# Patient Record
Sex: Male | Born: 1960 | Hispanic: Yes | Marital: Single | Smoking: Never smoker
Health system: Southern US, Community
[De-identification: ages and names within clinical notes are randomized; demographics above are authoritative.]

## PROBLEM LIST (undated history)

## (undated) HISTORY — PX: CHOLECYSTECTOMY: SHX55

## (undated) HISTORY — PX: BACK SURGERY: SHX140

---

## 2010-01-05 ENCOUNTER — Emergency Department: Payer: Self-pay | Admitting: Emergency Medicine

## 2010-01-27 ENCOUNTER — Ambulatory Visit: Payer: Self-pay | Admitting: Surgery

## 2010-02-21 ENCOUNTER — Inpatient Hospital Stay: Payer: Self-pay | Admitting: Surgery

## 2010-03-02 LAB — PATHOLOGY REPORT

## 2010-03-20 ENCOUNTER — Other Ambulatory Visit: Payer: Self-pay | Admitting: Surgery

## 2010-03-22 ENCOUNTER — Ambulatory Visit: Payer: Self-pay | Admitting: Surgery

## 2010-03-24 LAB — PATHOLOGY REPORT

## 2011-06-14 IMAGING — CT CT CHEST-ABD-PELV W/ CM
1 of 2 series · 13 of 32 positions shown, 18 images · IV contrast (APPLIED)
Comparison: none

REASON FOR EXAM: (1) hypoxia; (2) abd pain
COMMENTS:

PROCEDURE:     CT  - CT CHEST ABDOMEN AND PELVIS W  - February 26, 2010 [DATE]
RESULT:     Comparison: MR cholangiogram 02/23/2010
TECHNIQUE: Multiple axial images obtained of the chest, abdomen, and pelvis,
after administration of 100 mL Isovue 370 and oral contrast. The chest
images were obtained according to the PE protocol. These images were
reviewed on a [employer] multiplanar work station.

[Series 6: abdomen · axial · 0.80mm/px · z∈[-486,-42]mm · 13 of 166 slices shown, 18 images]
[im 9/166  soft-tissue]
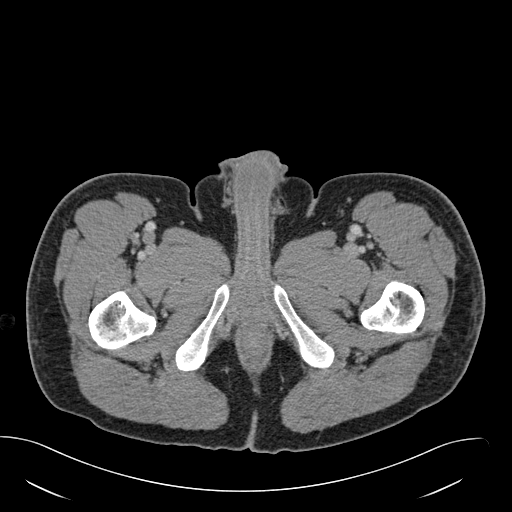
[im 9/166  bone]
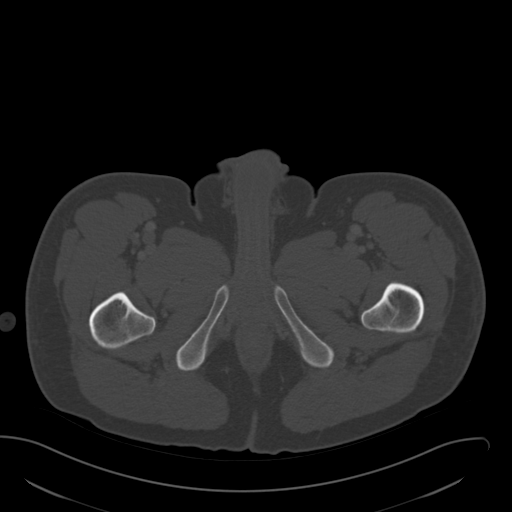
[im 25/166  soft-tissue]
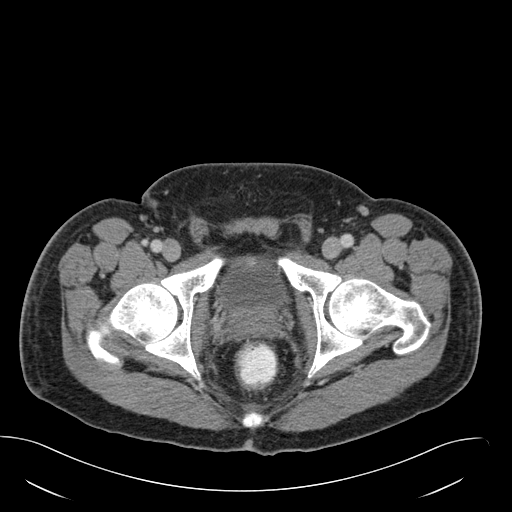
[im 34/166  soft-tissue]
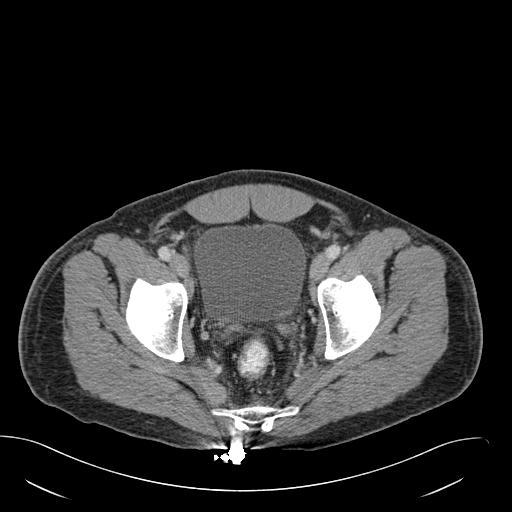
[im 50/166  soft-tissue]
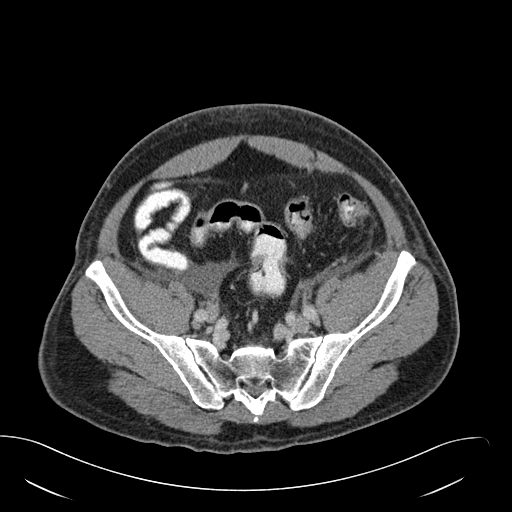
[im 67/166  soft-tissue]
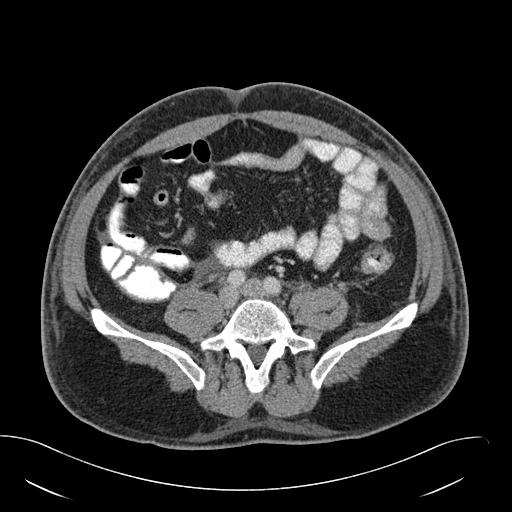
[im 75/166  soft-tissue]
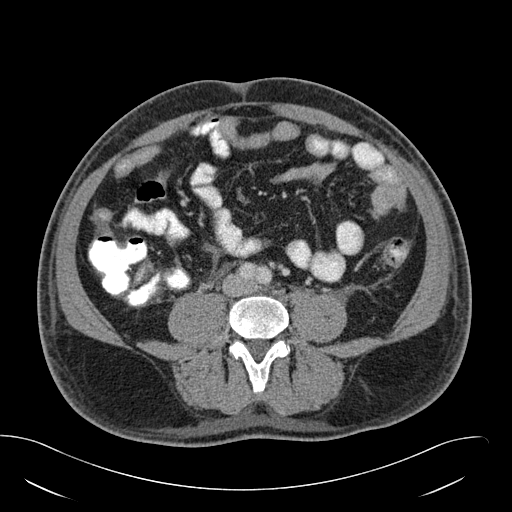
[im 91/166  soft-tissue]
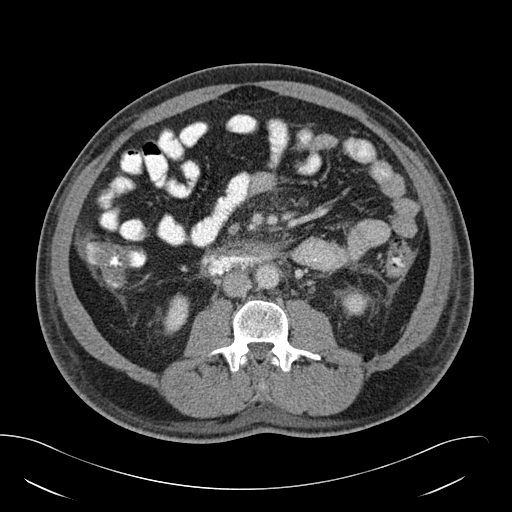
[im 100/166  soft-tissue]
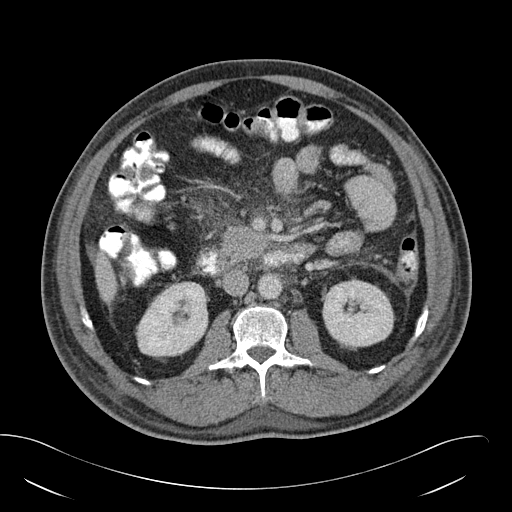
[im 116/166  soft-tissue]
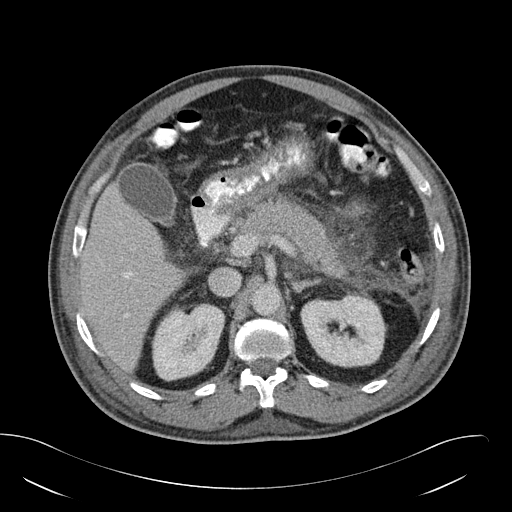
[im 116/166  bone]
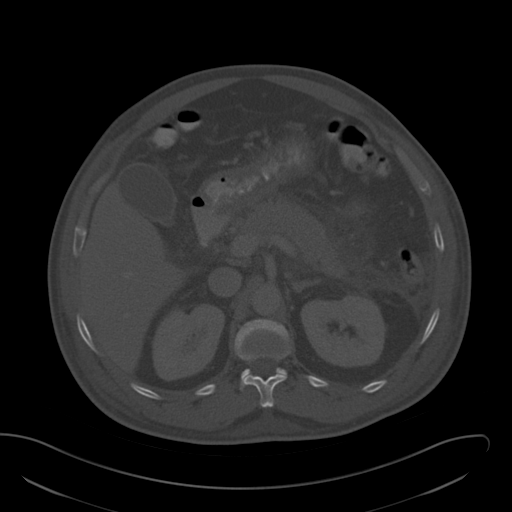
[im 133/166  soft-tissue]
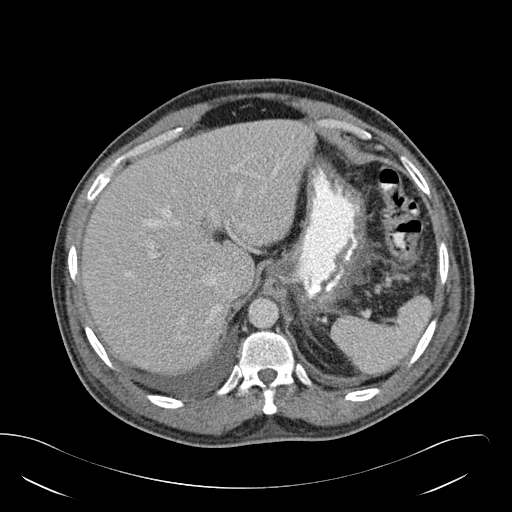
[im 133/166  lung]
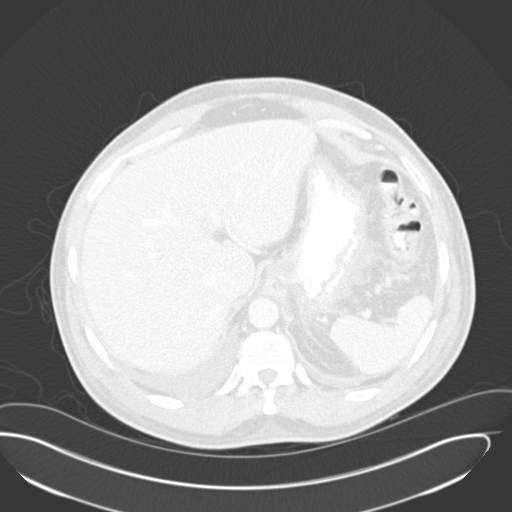
[im 141/166  soft-tissue]
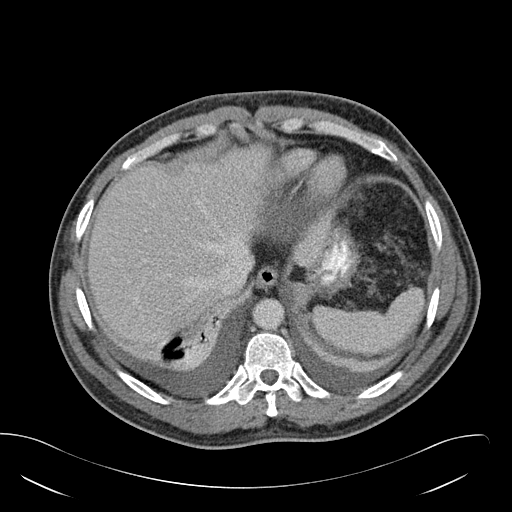
[im 141/166  lung]
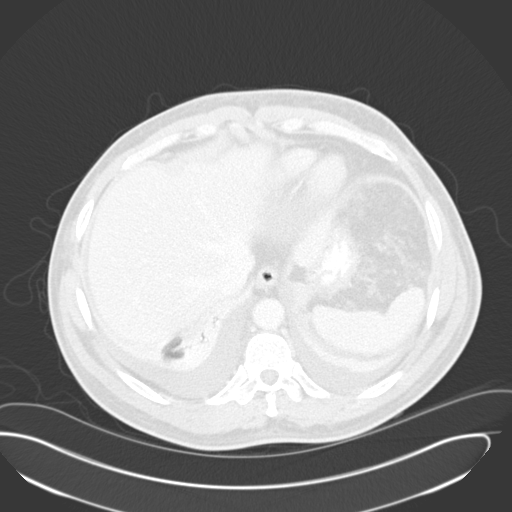
[im 149/166  lung]
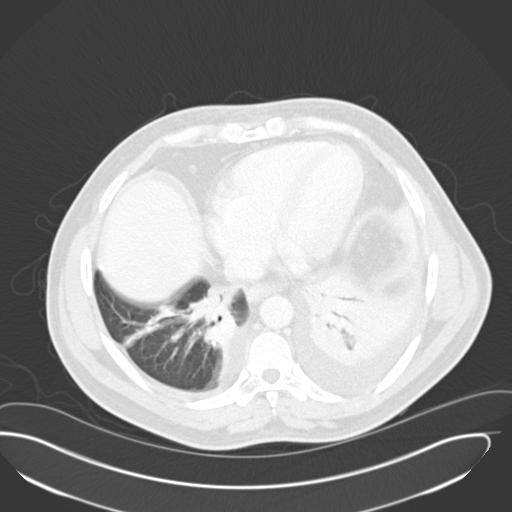
[im 157/166  soft-tissue]
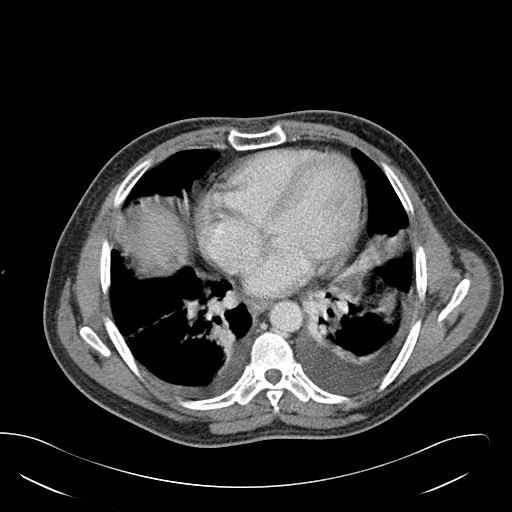
[im 157/166  lung]
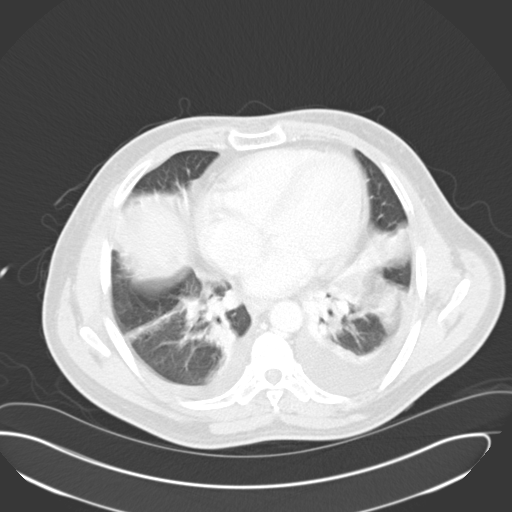

[13 of 32 positions shown; findings below may reference images not displayed]

FINDINGS: No mediastinal, hilar, or axillary lymphadenopathy. There are small
bilateral pleural effusions. The thoracic aorta is normal in caliber.
Evaluation of the segmental arteries in the lower lobes is limited by the
atelectasis. Otherwise, no pulmonary embolus identified. Homogeneous opacity
and consolidation with volume loss in the lower lobes likely related to
atelectasis. Mild linear opacities in the left upper lobe likely represent
atelectasis.

The liver, gallbladder, spleen, and adrenals are unremarkable. There is a
moderate degree of inflammatory stranding surrounding the pancreas. There
are no focal areas of hypoattenuation within the pancreas. The stranding
extends inferiorly along the lateral conal fracture. No defined fluid
collections. The splenic vein is patent. Comparison with the prior MRI is
difficult secondary to differences in technique, but the degree of stranding
appears increased.  There is a small, 1.0 cm soft tissue filling defect in
in the second portion of the duodenum in the region of the ampulla of Vater.
This may represent an edematous ampulla, however an mass cannot excluded.
This is seen on image 70 of the abdominal CT images. The pancreatic duct is
not dilated.

The small and large bowel are normal in caliber. The appendix is not
definitely identified. However, there are no discrete inflammatory changes
about the cecum.

No aggressive lytic or sclerotic osseous lesions identified.
IMPRESSION: 1. No pulmonary embolus identified. Evaluation of the lower lobe segmental
arteries is limited by atelectasis in the lower lobes.
2. Small bilateral pleural effusions. Bibasilar consolidation and volume
loss likely related to atelectasis. Infection would be difficult to exclude.
3. Findings of acute pancreatitis, which appear slightly increased from
prior MRI. There are no defined fluid collection at this time.
4. Small soft tissue filling defect in the region of the ampulla of Vater
may represent an edematous ampulla. However, a small mass cannot be
excluded. Further evaluation with ERCP when clinically able is suggested.

## 2011-07-18 ENCOUNTER — Ambulatory Visit: Payer: Self-pay | Admitting: Surgery

## 2011-07-18 LAB — CBC
HCT: 41.6 % (ref 40.0–52.0)
HGB: 13.8 g/dL (ref 13.0–18.0)
MCHC: 33.2 g/dL (ref 32.0–36.0)
MCV: 90 fL (ref 80–100)
Platelet: 254 10*3/uL (ref 150–440)
RBC: 4.61 10*6/uL (ref 4.40–5.90)
WBC: 9.7 10*3/uL (ref 3.8–10.6)

## 2011-07-18 LAB — BASIC METABOLIC PANEL
Anion Gap: 6 — ABNORMAL LOW (ref 7–16)
BUN: 16 mg/dL (ref 7–18)
Co2: 28 mmol/L (ref 21–32)
EGFR (African American): >60
EGFR (Non-African Amer.): >60
Potassium: 3.5 mmol/L (ref 3.5–5.1)

## 2011-07-25 ENCOUNTER — Ambulatory Visit: Payer: Self-pay | Admitting: Surgery

## 2011-07-26 LAB — PATHOLOGY REPORT

## 2012-09-18 ENCOUNTER — Inpatient Hospital Stay: Payer: Self-pay | Admitting: Internal Medicine

## 2012-09-18 LAB — COMPREHENSIVE METABOLIC PANEL
Albumin: 2.9 g/dL — ABNORMAL LOW (ref 3.4–5.0)
Anion Gap: 7 (ref 7–16)
BUN: 15 mg/dL (ref 7–18)
Calcium, Total: 9.8 mg/dL (ref 8.5–10.1)
Chloride: 104 mmol/L (ref 98–107)
Co2: 27 mmol/L (ref 21–32)
Creatinine: 0.92 mg/dL (ref 0.60–1.30)
EGFR (African American): >60
EGFR (Non-African Amer.): >60
Glucose: 113 mg/dL — ABNORMAL HIGH (ref 65–99)
Osmolality: 277 (ref 275–301)
Potassium: 3.5 mmol/L (ref 3.5–5.1)
SGOT(AST): 52 U/L — ABNORMAL HIGH (ref 15–37)
SGPT (ALT): 67 U/L (ref 12–78)
Sodium: 138 mmol/L (ref 136–145)
Total Protein: 8 g/dL (ref 6.4–8.2)

## 2012-09-18 LAB — CBC
HCT: 38.9 % — ABNORMAL LOW (ref 40.0–52.0)
HGB: 13.2 g/dL (ref 13.0–18.0)
MCHC: 34.1 g/dL (ref 32.0–36.0)
WBC: 16.7 10*3/uL — ABNORMAL HIGH (ref 3.8–10.6)

## 2012-09-19 LAB — BASIC METABOLIC PANEL
Anion Gap: 6 — ABNORMAL LOW (ref 7–16)
Calcium, Total: 9.2 mg/dL (ref 8.5–10.1)
Chloride: 105 mmol/L (ref 98–107)
Co2: 27 mmol/L (ref 21–32)
Creatinine: 1 mg/dL (ref 0.60–1.30)
EGFR (Non-African Amer.): >60
Glucose: 125 mg/dL — ABNORMAL HIGH (ref 65–99)
Osmolality: 277 (ref 275–301)

## 2012-09-19 LAB — CBC WITH DIFFERENTIAL/PLATELET
Eosinophil #: 0.3 10*3/uL (ref 0.0–0.7)
Eosinophil %: 2.4 %
HGB: 12 g/dL — ABNORMAL LOW (ref 13.0–18.0)
Lymphocyte %: 15 %
MCH: 29.4 pg (ref 26.0–34.0)
MCHC: 33.7 g/dL (ref 32.0–36.0)
MCV: 87 fL (ref 80–100)
Monocyte #: 1.2 x10 3/mm — ABNORMAL HIGH (ref 0.2–1.0)
Monocyte %: 9.4 %
RBC: 4.08 10*6/uL — ABNORMAL LOW (ref 4.40–5.90)

## 2012-09-19 LAB — HEPATIC FUNCTION PANEL A (ARMC)
Albumin: 2.5 g/dL — ABNORMAL LOW (ref 3.4–5.0)
Alkaline Phosphatase: 255 U/L — ABNORMAL HIGH (ref 50–136)
Bilirubin, Direct: 0.2 mg/dL (ref 0.00–0.20)
Bilirubin,Total: 0.6 mg/dL (ref 0.2–1.0)
Total Protein: 7 g/dL (ref 6.4–8.2)

## 2012-09-20 LAB — CBC WITH DIFFERENTIAL/PLATELET
Basophil %: 0.7 %
Eosinophil #: 0.5 10*3/uL (ref 0.0–0.7)
HCT: 35.6 % — ABNORMAL LOW (ref 40.0–52.0)
HGB: 11.8 g/dL — ABNORMAL LOW (ref 13.0–18.0)
Lymphocyte %: 33.8 %
MCV: 88 fL (ref 80–100)
Platelet: 332 10*3/uL (ref 150–440)
RBC: 4.05 10*6/uL — ABNORMAL LOW (ref 4.40–5.90)
RDW: 15.1 % — ABNORMAL HIGH (ref 11.5–14.5)
WBC: 12 10*3/uL — ABNORMAL HIGH (ref 3.8–10.6)

## 2012-09-20 LAB — VANCOMYCIN, TROUGH: Vancomycin, Trough: 8 ug/mL — ABNORMAL LOW (ref 10–20)

## 2012-09-21 LAB — CBC WITH DIFFERENTIAL/PLATELET
Basophil %: 0.7 %
Eosinophil #: 0.5 10*3/uL (ref 0.0–0.7)
Lymphocyte #: 4.2 10*3/uL — ABNORMAL HIGH (ref 1.0–3.6)
MCH: 29.7 pg (ref 26.0–34.0)
MCHC: 33.8 g/dL (ref 32.0–36.0)
MCV: 88 fL (ref 80–100)
Monocyte #: 1.2 x10 3/mm — ABNORMAL HIGH (ref 0.2–1.0)
RDW: 15.1 % — ABNORMAL HIGH (ref 11.5–14.5)
WBC: 10.7 10*3/uL — ABNORMAL HIGH (ref 3.8–10.6)

## 2012-09-22 LAB — CBC WITH DIFFERENTIAL/PLATELET
Basophil %: 0.7 %
Eosinophil #: 0.6 10*3/uL (ref 0.0–0.7)
HCT: 33.9 % — ABNORMAL LOW (ref 40.0–52.0)
HGB: 11.5 g/dL — ABNORMAL LOW (ref 13.0–18.0)
Lymphocyte %: 25.8 %
MCHC: 34 g/dL (ref 32.0–36.0)
MCV: 88 fL (ref 80–100)
Neutrophil #: 8.7 10*3/uL — ABNORMAL HIGH (ref 1.4–6.5)
Platelet: 383 10*3/uL (ref 150–440)
RDW: 14.9 % — ABNORMAL HIGH (ref 11.5–14.5)
WBC: 14.2 10*3/uL — ABNORMAL HIGH (ref 3.8–10.6)

## 2012-09-22 LAB — VANCOMYCIN, TROUGH: Vancomycin, Trough: 7 ug/mL — ABNORMAL LOW (ref 10–20)

## 2012-09-23 LAB — CBC WITH DIFFERENTIAL/PLATELET
Basophil #: 0.1 10*3/uL (ref 0.0–0.1)
Eosinophil #: 0.5 10*3/uL (ref 0.0–0.7)
Eosinophil %: 3.3 %
HCT: 37 % — ABNORMAL LOW (ref 40.0–52.0)
HGB: 12.6 g/dL — ABNORMAL LOW (ref 13.0–18.0)
Lymphocyte %: 17.7 %
MCH: 29.9 pg (ref 26.0–34.0)
Monocyte #: 1 x10 3/mm (ref 0.2–1.0)
Monocyte %: 6.5 %
Neutrophil #: 10.9 10*3/uL — ABNORMAL HIGH (ref 1.4–6.5)
Neutrophil %: 72 %
Platelet: 416 10*3/uL (ref 150–440)
RBC: 4.21 10*6/uL — ABNORMAL LOW (ref 4.40–5.90)
WBC: 15.2 10*3/uL — ABNORMAL HIGH (ref 3.8–10.6)

## 2012-09-23 LAB — PATHOLOGY REPORT

## 2012-09-23 LAB — WOUND CULTURE

## 2012-09-24 LAB — WBC: WBC: 12.3 10*3/uL — ABNORMAL HIGH (ref 3.8–10.6)

## 2014-06-18 NOTE — Discharge Summary (Signed)
PATIENT NAME:  Miguel [name], Miguel [name] MR#:  [id number] DATE OF BIRTH:  07/29/1960  DATE OF ADMISSION:  09/18/2012 DATE OF DISCHARGE:  09/24/2012   ADMITTING PHYSICIAN: Miguel Furnaceoberto Sanchez Gutierrez, MD  DISCHARGING PHYSICIAN: Miguel Baasadhika Madgeline Rayo, MD  PRIMARY CARE PHYSICIAN: None.   CONSULTATIONS IN THE HOSPITAL: Podiatry consultation by Dr. Linus Galasodd [name].   FINAL DIAGNOSES: 1.  Sepsis.  2.  Right foot dorsum of the foot abscess/cellulitis, status post incision and drainage.  3.  Right groin pain secondary to referral pain from his right foot movement.   DISCHARGE HOME MEDICATIONS: 1.  Ciprofloxacin 500 mg [name].o. b.i.d. for 7 days.  2.  Augmentin 875 mg [name].o. b.i.d. for 7 days.  3.  Flexeril 5 mg tablet 1 to 2 tablets q.8 hours [name].r.n.  4.  Percocet 5/325 mg 2 tablets q.8 hours [name].r.n. for pain.   DISCHARGE DIET: Regular diet.   DISCHARGE ACTIVITY: As tolerated.   FOLLOWUP INSTRUCTIONS:  1.  Follow up with Dr. Linus Galasodd [name], podiatry, in 1 week.  2.  Home health PT and nursing.  3.  Dressing changes on the right foot dorsum, needs packing every day and pressure bandage on it.  LABORATORY AND IMAGING STUDIES PRIOR TO DISCHARGE: WBC was improved down to 12.3, hemoglobin 12.6, hematocrit 37.0, platelet count 416.   Ultrasound Doppler lower extremity on the right side showing no evidence of any DVT or fluid collection noted.   Cellulitis and abscess negative for malignancy from the infected tissue on the right foot biopsy from the  I and D.   Wound cultures growing light growth of Streptococcus pyogenes.  Abdominal ultrasound showing status post cholecystectomy, otherwise normal.  CT of the right foot showing diffuse soft tissue swelling, otherwise unremarkable, no evidence of foreign body or abscess.   Sodium 138, potassium 3.4, chloride 105, bicarbonate 27, BUN 12, creatinine 1, glucose 125 and calcium of 9.2.   ALT 106, AST 199, alkaline phosphatase 255, total bilirubin of 0.6.   Blood cultures  remain negative.   BRIEF HOSPITAL COURSE: Miguel [name] is a 54 year old Hispanic male, who can speak English, with no significant past medical history. Went to urgent care after he had trauma to his right leg and has injured on the dorsum of the leg between right third, fourth and fifth toes. However, it appeared more cellulitic with white count elevated at 16,000, so was sent over to the ER and was admitted for the same. The patient had a high-grade fever of 103 reported at home, WBC elevated, so was given the diagnosis of sepsis. 1.  Early sepsis/systemic inflammatory response syndrome secondary to right foot abscess and cellulitis, started after trauma. The patient was started on vancomycin and Zosyn. Podiatry was consulted. He had incision and drainage done from that foot on 09/19/2012. Postop cultures were showing by Strep pyogenes. His antibiotics were changed over to ciprofloxacin and also Augmentin. The patient persistently complained of right groin pain, which was examined, It  is actually pain in the inner thigh region, which was worsened with moving of the foot, and no particular motion of the thigh is restricted and no local erythema or tenderness noted. Ultrasound done for that leg did not show any fluid collection or any deep venous thrombosis. The pain was improving gradually as the foot infection was improving. Podiatry actually did packing in the wound and his erythema and swelling of the foot is much better, and there is not much pus coming out at this time. Home health was recommended  for daily dressing changes and next week, followup with podiatry is recommended. The patient's white count is improving. He is being discharged on 2 [name].o. antibiotics and also pain medications as needed and will follow up with Dr. Linus Galas next week.  2.  His course has been otherwise uneventful in the hospital.   DISCHARGE CONDITION: Stable.   DISCHARGE DISPOSITION: Home.   TIME SPENT ON DISCHARGE: 45  minutes.  ____________________________ Miguel Baas, MD rk:jm D: 09/24/2012 13:48:33 ET T: 09/24/2012 14:37:08 ET JOB#: [id number]  cc: Miguel Baas, MD, <Dictator> Miguel Baas MD ELECTRONICALLY SIGNED 09/29/2012 14:55

## 2014-06-18 NOTE — H&P (Signed)
PATIENT NAME:  Miguel [name], Miguel [name] MR#:  [id number] DATE OF BIRTH:  1960-10-26  DATE OF ADMISSION:  09/18/2012  REASON FOR ADMISSION: Right lower extremity cellulitis; failure to treat outpatient.   PRIMARY CARE PHYSICIAN: None.   REFERRING PHYSICIAN: Dr. Jene Every.   CHIEF COMPLAINT: Edema and pain of the right lower extremity.   HISTORY OF PRESENT ILLNESS: This is a very nice 54 year old gentleman who has a history of being overall healthy. He states that he has not seen a doctor in awhile because he has not needed it, but comes referred from Urgent Care due to cellulitis of the right lower extremity.   Apparently, [place] 5 [place]. He had some trauma at the level of the right lower extremity in between the 3rd, 4th,  and 5th toes. He hit it against a rock and he also hit his chin against a rock.   The patient states that he was fine. He continued to get in the water and walk around, and after that he went home. He did okay, but in the middle of the night he started developing some low-grade fevers. The next day he had swelling and pain on the right lower extremity. He went to see the clinic at his work and he was not given any new medications, and today the problem was getting worse for which he went to the Urgent Care. At the Urgent Care. He was referred here to the ER.   He has significant pain of the right lower extremity, worse with weightbearing pain. The pain goes away completely whenever he raises his leg up. His pain, whenever he bears weight, is  7 out of 10, sharp, radiating up from the toes up the knee.   The patient noticed erythema and has been advancing from the tip of the toes up to the ankle now, and now he also has redness at the level of the mid-tibial area with edema, which was related to trauma as well.   The patient says there is a lot of itchiness. He had a fever at home for the past 3 to 4 days, but here his temperature is  normal.   The patient denies any other issues. The patient is admitted for treatment of his condition.   REVIEW OF SYSTEMS: CONSTITUTIONAL: Denies any fever, fatigue, weakness, weight loss or weight gain.   EYES: No double vision or blurry vision.  EARS, NOSE, THROAT: No tinnitus. No difficulty swallowing. No epistaxis.  RESPIRATORY: No cough, wheezing, COPD or chest pain with respirations.   CARDIOVASCULAR: No chest pain, orthopnea, syncope or palpitations.  GASTROINTESTINAL: No nausea, vomiting, abdominal pain, constipation, or diarrhea.  GENITOURINARY: No dysuria, hematuria, changes in frequency. No prostatitis.  ENDOCRINOLOGIC: No polyuria, polydipsia, polyphagia, cold or heat intolerance. HEMATOLOGIC AND LYMPHATIC: No anemia, easy bruising or bleeding.  SKIN: Positive rash of the right lower extremity, erythema, cellulitis. No previous rashes or new lesions.  MUSCULOSKELETAL: No significant neck pain, back pain, joint pain other than that associated with the cellulitic process. No gout.  NEUROLOGIC: No numbness, tingling, CVAs or TIAs.  PSYCHIATRIC: No significant insomnia, depression or bipolar disorder.   PAST MEDICAL HISTORY: None. The patient states that he has been healthy.   PAST SURGICAL HISTORY: Cholecystectomy, and lipoma removal off his upper back.   ALLERGIES: NO KNOWN DRUG ALLERGIES.   CURRENT MEDICATIONS: The patient was prescribed ciprofloxacin in the clinic. He does not take any medications on a regular  basis.   SOCIAL HISTORY: The patient does not smoke. He states he has never smoked. He does not drink currently. He used to drink before, but not heavily. He lives with his girlfriend, and she has a son. He works in a mill.   FAMILY HISTORY: Denies any coronary artery disease, hypertension, diabetes. He states his family is healthy.   PHYSICAL EXAMINATION: VITAL SIGNS: Blood pressure of 119/77, pulse 89 respiratory rate 18, temperature 98.4, oxygen saturation 99% on  room air.  GENERAL: The patient is alert, oriented x 3. No acute distress. No respiratory distress. Hemodynamically stable.  HEENT: Pupils are equal and reactive. Extraocular movements are intact. Mucosa are moist. Anicteric sclerae. Pink conjunctivae. No oral lesions. No oropharyngeal exudates.  NECK: Supple. No JVD. No thyromegaly. No adenopathy. No carotid bruits. No rigidity.  CARDIOVASCULAR: Regular rate and rhythm. No murmurs, rubs or gallops. No displacement of PMI.  LUNGS: Are clear, without any wheezing or crepitus. No use of accessory muscles.  ABDOMEN: Soft, nontender, nondistended. No hepatosplenomegaly. No masses. Bowel sounds are positive.  EXTREMITIES: Positive edema at the level of the foot up to the ankle on the right lower extremity, associated with cellulitic process, excoriation at the level of the top of the metatarsophalangeal joints, with some suppuration of clear liquid at this moment. He has some greenish tint at the level of the toenails on the 4th and 5th dose. He has significant edema, which is pitting, redness and increased temperature locally, and tenderness to palpation at the level. There is no significant fluctuance of fluid. No signs of abscesses.   The redness stops at the level of the ankle and then starts again at the level of the mid-tibial area, where there is an erythematous area about gold-sized which is also related to trauma.   There is some broken down skin at the level of the web spaces on the 3rd, 4th, and 5th toes.   SKIN: No rashes or petechiae. Positive adenopathy is at the level of the right groin. No lymphadenopathy in neck or supraclavicular areas.  VASCULAR: Pulses are palpable, +2. Capillary refill is less than 3.  NEUROLOGIC: Cranial nerves II-XII intact. Strength is equal in 4 extremities.  PSYCHIATRIC: Negative for significant agitation.  MUSCULOSKELETAL: No significant joint effusions. No tenderness to mobilization of the joints at the level  of the ankle or knee.   RESULTS: Right foot x-rays. No signs of osteomyelitis.   Glucose 115, creatinine is 0.92. Other electrolytes within normal limits. Potassium is 3.5, albumin is 2.9, alkaline phosphatase 203, AST 52. The patient denies drinking.   White count is 16.7, hemoglobin 13.2, hematocrit 38, platelet count 281.   ASSESSMENT AND PLAN: This is a very nice 54 year old gentleman with no previous history who comes with cellulitis of the right lower extremity, failure of treatment.  1.  Cellulitis of the right lower extremity: The patient had significant edema, elevation of white blood count to 16,000. [place], and lake water could be a good source of infections including pseudomonas, for which we are going to cover him with Zosyn, and vancomycin.  2.  Blood cultures have been taken. We are going to try to get wound cultures of the area. We are going to get a podiatry consultation as well for possible surgical knee if there is worsening. At this moment there are no signs of abscesses.  3.  The patient is going to be monitored closely with vitals, white at 12 in  the morning. 4.  The patient was started on ciprofloxacin. We are going to stop that since the patient is going to be on Zosyn.  5.  No fractures on bones at the level of the toe. Patient has no other risk factors.  6. Elevated white blood count secondary to infection: We are going to treat with antibiotics.  7. Elevated AST and alkaline phosphatase: We are going to get GGTP; the patient does not have a gallbladder. It was removed and he does not have any significant complaints about that.  8.  Other medical problems stable. He has deep vein thrombosis prophylaxis with heparin as the patient is not moving. GI prophylaxis with Protonix as the patient is going to be on antibiotics, to prevent gastric ulcers from stress.   HE IS A FULL CODE.   I spent about 45 minutes with this admission.     ____________________________ Felipa Furnace, MD rsg:dm D: 09/18/2012 14:44:55 ET T: 09/18/2012 15:07:10 ET JOB#: [id number]  cc: Felipa Furnace, MD, <Dictator> Asriel Westrup Juanda Chance MD ELECTRONICALLY SIGNED 09/21/2012 0:38

## 2014-06-18 NOTE — Consult Note (Signed)
PATIENT NAME:  Miguel [name], Miguel [name] MR#:  [id number] DATE OF BIRTH:  [birth year]  DATE OF CONSULTATION:  09/19/2012  CONSULTING PHYSICIAN:  Linus Galasodd Delaney Perona, DPM  REASON FOR CONSULTATION: This is a 54 year old male, overall healthy, who relates a history of swimming [place] the lake this past Saturday and falling against a rock and injuring his right foot. Later that night, he did develop some fever, low-grade, and pain and swelling and redness in the right foot. He subsequently was seen at work. Continued to have increasing pain, swelling and redness in the right foot and ankle area and presented to the urgent care yesterday, where he was referred to the Emergency Department. Relates severe pain whenever he steps on the foot or presses on the forefoot. Has had a little drainage out of the fourth and fifth toe area.   PAST MEDICAL HISTORY: Unremarkable.   PAST SURGICAL HISTORY: Cholecystectomy and lipoma removal, upper back.   MEDICATIONS: Does not take any regular home medications.   ALLERGIES: No known drug allergies.   FAMILY HISTORY: Unremarkable.   SOCIAL HISTORY: No tobacco use. Does not currently use any alcohol. He is not married but lives with his girlfriend. Works at a [employer].   REVIEW OF SYSTEMS: Significant redness and swelling in the right foot with some drainage from between the fourth and fifth toes. Denies any numbness or paresthesias. Does have some fever and chills throughout the last week. Review of systems is otherwise unremarkable.   PHYSICAL EXAMINATION: VASCULAR: DP and PT pulses are palpable. Capillary filling time within normal limits.  NEUROLOGICAL: Epicritic sensations are grossly intact bilateral.  INTEGUMENTARY: Skin is warm, dry and supple. There is significant erythema and edema in the entire right forefoot. There is a full-thickness ulceration on the lateral aspect of the right fourth toe with a large amount of purulent debride that is expressed. Appears to be abscess  formation starting deeper up into the forefoot area with some blanching of the dorsal skin.  MUSCULOSKELETAL: Guarded range of motion in the foot. Exquisite pain on any attempted palpation in the forefoot. Muscle testing is deferred.   X-RAYS: Two views of the right foot taken yesterday reveal bony architecture to be intact. There is significant edema noted in the lateral forefoot and right fourth and fifth toes. Suspicious area for possible early gas in the tissues, but this was unclear when reviewed with radiology. No evidence of any fracture is seen. No clear evidence of any radiopaque foreign body.   IMPRESSION: 1.  Abscess, evidence for early gas or foreign body, right forefoot.  2.  Cellulitis.   PLAN: After speaking with radiology, we will obtain a CT for evaluation of air as well as possible foreign body. The patient has been n.[name].o. since breakfast for an abdominal ultrasound, and we will keep him n.[name].o. and plan for I and D of his right foot tonight. Consent form will be obtained. We will follow him accordingly throughout the weekend.   ____________________________ Linus Galasodd Orian Amberg, DPM tc:jm D: 09/19/2012 17:00:20 ET T: 09/19/2012 19:44:05 ET JOB#: [id number]  cc: Linus Galasodd Solyana Nonaka, DPM, <Dictator> Yousaf Sainato DPM ELECTRONICALLY SIGNED 09/25/2012 10:25

## 2014-06-18 NOTE — Op Note (Signed)
PATIENT NAME:  Miguel [name], Miguel [name] MR#:  [id number] DATE OF BIRTH:  [birth year]  DATE OF PROCEDURE:  09/19/2012  PREOPERATIVE DIAGNOSIS:  Abscess, right fourth toe and forefoot.  POSTOPERATIVE DIAGNOSIS:  Abscess, right fourth toe and forefoot.   PROCEDURE:  Incision and drainage with excisional debridement, abscess, right forefoot.   SURGEON:  Linus Galasodd Reeanna Acri, DPM.   ANESTHESIA:  General LMA.   HEMOSTASIS:  Pneumatic tourniquet, right ankle, 250 mmHg.   ESTIMATED BLOOD LOSS:  Minimal.   PATHOLOGY:  Infected tissue right forefoot.   CULTURES:  Aerobic and anaerobic abscess, right forefoot.   DRAINS:  Sterile saline-soaked gauze packed into the wounds.   COMPLICATIONS:  None apparent.   OPERATIVE INDICATIONS:  This is a 54 year old male with recent development of pain and swelling in his right foot after an injury while [place].  The patient was admitted for increasing redness, pain as well as fever and chills.  Abscess was identified and decision made for incision and drainage.   OPERATIVE PROCEDURE:  The patient was taken to the Operating Room and placed on the table in the supine position.  Following satisfactory general anesthesia, a pneumatic tourniquet was applied at the level of the right ankle and the foot was prepped and draped in the usual sterile fashion.  The foot was elevationally exsanguinated and the tourniquet inflated to 250 mmHg.  Attention was then directed to the dorsal aspect of the right foot where an approximate 4 cm linear incision was made coursing proximal to distal over the fourth metatarsal area at the area of fluctuant lesion.  On incision there was a large amount of purulent drainage that was expressed through the incision.  This was cleaned out manually.  Several small tracts were noted going in between the fourth and fifth metatarsals towards the plantar aspect as well as some proximally.  These were explored and the tissue was removed using hemostats and  rongeur.  The wound was flushed with irrigation.  Next, a VersaJet debrider was used on a setting of 7 to debride excisionally the devitalized tissue that remained in the periphery of the wound.  The wound was again flushed with copious amounts of pulsed irrigation, 3 liters in total.  The wound was then closed using 4-0 nylon simple interrupted sutures with an area left open in the central aspect of the incision for drainage.  Saline 4 x 4 gauze were packed into the dorsal wound as well as the original entrance wound along the lateral aspect of the fourth toe.  4 x 4's, fluffs, ABD and Kerlix followed by an Ace wrap were then applied to the right foot and ankle.  The tourniquet was released.  The patient tolerated the procedure and anesthesia well and was transported to the PACU with vital signs stable and in good condition.     ____________________________ Linus Galasodd Jasira Robinson, DPM tc:ea D: 09/19/2012 19:38:10 ET T: 09/20/2012 03:18:06 ET JOB#: [id number]  cc: Linus Galasodd Yusuf Yu, DPM, <Dictator> Tracie Dore DPM ELECTRONICALLY SIGNED 09/25/2012 10:25

## 2014-06-20 NOTE — Op Note (Signed)
PATIENT NAME:  Miguel [name], [name] MR#:  [id number] DATE OF BIRTH:  [birth year]  DATE OF PROCEDURE:  07/25/2011  PREOPERATIVE DIAGNOSIS: Mass of the upper back.   POSTOPERATIVE DIAGNOSES: Mass of the upper back.   PROCEDURE: Excision of mass of the upper back.   SURGEON: Renda RollsWilton Smith, MD  ANESTHESIA: Local 1% Xylocaine with epinephrine and monitored anesthesia care with intravenous sedation.   INDICATIONS: This 54 year old male has a slowly enlarging mass of the upper back. He was seen and evaluated in the office and due to the gradual enlargement, and appeared to be of indeterminate potential, surgery was recommended for definitive treatment.   DESCRIPTION OF PROCEDURE: The patient was brought into the Operating Room on the stretcher and was rolled over onto the operating table in the prone position. He was monitored by anesthesia and sedated. The site at the upper back was near the midline and was prepared with ChloraPrep and draped in a sterile manner.   The skin overlying the mass was infiltrated with a transversely oriented line of infiltration with 1% Xylocaine with epinephrine. Next, a transverse incision was made some 8 cm in length and was carried down through subcutaneous tissues so that an ellipse of skin approximately 12 mm wide was excised with the underlying mass. Dissection was carried down through subcutaneous tissues through fascia to encounter a fatty-appearing moderately lobulated mass. This was dissected free from surrounding tissues, dissected free from underlying muscle, with a combination of blunt and sharp dissection and also some use of electrocautery. The mass was completely excised and remained in continuity with the overlying skin and some subcutaneous tissue between the skin in the underlying mass. The maximal dimension was 9 cm. It was submitted in formalin for routine pathology. The wound was inspected. One clamped vessel was suture ligated with 4-0 chromic. Multiple small  bleeding points were cauterized. Hemostasis was subsequently intact. Some additional 1% Xylocaine with epinephrine was infiltrated so that by the end of the procedure some 28 mL had been used. Next, the wound was closed using a running 3-0 Monocryl in the fascia, which was interrupted in the midline, and then closed the skin with a running 4-0 Monocryl subcuticular suture and Dermabond.   The patient tolerated surgery satisfactorily, was moved over onto the stretcher in the prone position, and then transported to the Recovery Room. ____________________________ Shela CommonsJ. Renda RollsWilton Smith, MD jws:slb D: 07/25/2011 08:48:02 ET T: 07/25/2011 11:50:23 ET JOB#: [id number]  cc: Adella HareJ. Wilton Smith, MD, <Dictator> Adella HareWILTON J SMITH MD ELECTRONICALLY SIGNED 07/27/2011 18:41

## 2014-08-10 ENCOUNTER — Encounter: Payer: Self-pay | Admitting: Emergency Medicine

## 2014-08-10 ENCOUNTER — Emergency Department
Admission: EM | Admit: 2014-08-10 | Discharge: 2014-08-10 | Disposition: A | Discharge status: Home / Self Care | Payer: Worker's Compensation | Attending: Emergency Medicine | Admitting: Emergency Medicine

## 2014-08-10 DIAGNOSIS — S29011A Strain of muscle and tendon of front wall of thorax, initial encounter: Secondary | ICD-10-CM | POA: Diagnosis not present

## 2014-08-10 DIAGNOSIS — Y9289 Other specified places as the place of occurrence of the external cause: Secondary | ICD-10-CM | POA: Insufficient documentation

## 2014-08-10 DIAGNOSIS — Y9389 Activity, other specified: Secondary | ICD-10-CM | POA: Diagnosis not present

## 2014-08-10 DIAGNOSIS — Y998 Other external cause status: Secondary | ICD-10-CM | POA: Insufficient documentation

## 2014-08-10 DIAGNOSIS — X58XXXA Exposure to other specified factors, initial encounter: Secondary | ICD-10-CM | POA: Diagnosis not present

## 2014-08-10 DIAGNOSIS — M25512 Pain in left shoulder: Secondary | ICD-10-CM | POA: Diagnosis present

## 2014-08-10 MED ORDER — CYCLOBENZAPRINE HCL 10 MG PO TABS
ORAL_TABLET | ORAL | Status: AC
  Administered 2014-08-10: 10 mg via ORAL
  Filled 2014-08-10: qty 1

## 2014-08-10 MED ORDER — IBUPROFEN 800 MG PO TABS: 800.0000 mg | ORAL_TABLET | Freq: Three times a day (TID) | ORAL | Status: DC | PRN

## 2014-08-10 MED ORDER — KETOROLAC TROMETHAMINE 60 MG/2ML IM SOLN
INTRAMUSCULAR | Status: AC
  Administered 2014-08-10: 60 mg via INTRAMUSCULAR
  Filled 2014-08-10: qty 2

## 2014-08-10 MED ORDER — CYCLOBENZAPRINE HCL 10 MG PO TABS: 10.0000 mg | ORAL_TABLET | Freq: Three times a day (TID) | ORAL | Status: AC | PRN

## 2014-08-10 MED ORDER — KETOROLAC TROMETHAMINE 60 MG/2ML IM SOLN
60.0000 mg | Freq: Once | INTRAMUSCULAR | Status: AC
  Administered 2014-08-10: 60 mg via INTRAMUSCULAR

## 2014-08-10 MED ORDER — CYCLOBENZAPRINE HCL 10 MG PO TABS
10.0000 mg | ORAL_TABLET | Freq: Once | ORAL | Status: AC
  Administered 2014-08-10: 10 mg via ORAL

## 2014-08-10 MED ORDER — TRAMADOL HCL 50 MG PO TABS: 50.0000 mg | ORAL_TABLET | Freq: Four times a day (QID) | ORAL | Status: DC | PRN

## 2014-08-10 NOTE — ED Provider Notes (Signed)
[facility] Emergency Department Provider Note  ____________________________________________  Time seen: Approximately 2248  I have reviewed the triage vital signs and the nursing notes.   HISTORY  Chief Complaint Shoulder Pain   Historian Patient    HPI Miguel [name] is a 54 y.o. male with left upper chest pain started today after he lifted something heavy felt something pull in his chest pain has slowly worsened tonight there is no radiation of the pain it's relieved by holding his arm up against his body or above his head denies any shortness of breath numbness tingling weakness diaphoresis or any other symptoms of note rates it about 8 out of 10 whenever he moves certain ways or pushes on the area and no other complaints at this time again   History reviewed. No pertinent past medical history.    There are no active problems to display for this patient.   History reviewed. No pertinent past surgical history.  Current Outpatient Rx  Name  Route  Sig  Dispense  Refill  . cyclobenzaprine (FLEXERIL) 10 MG tablet   Oral   Take 1 tablet (10 mg total) by mouth every 8 (eight) hours as needed for muscle spasms.   15 tablet   0   . ibuprofen (ADVIL,MOTRIN) 800 MG tablet   Oral   Take 1 tablet (800 mg total) by mouth every 8 (eight) hours as needed.   30 tablet   0   . traMADol (ULTRAM) 50 MG tablet   Oral   Take 1 tablet (50 mg total) by mouth every 6 (six) hours as needed.   12 tablet   0     Allergies Review of patient's allergies indicates no known allergies.  History reviewed. No pertinent family history.  Social History History  Substance Use Topics  . Smoking status: Never Smoker   . Smokeless tobacco: Not on file  . Alcohol Use: Yes    Review of Systems Constitutional: No fever.  Baseline level of activity. Eyes: No visual changes.  No red eyes/discharge. ENT: No sore throat.  Not pulling at ears. Cardiovascular:  Negative for chest pain/palpitations. Respiratory: Negative for shortness of breath. Gastrointestinal: No abdominal pain.  No nausea, no vomiting.  No diarrhea.  No constipation. Genitourinary: Negative for dysuria.  Normal urination. Musculoskeletal: Negative for back pain. Skin: Negative for rash. Neurological: Negative for headaches, focal weakness or numbness.  10-point ROS otherwise negative.  ____________________________________________   PHYSICAL EXAM:  VITAL SIGNS: ED Triage Vitals  Enc Vitals Group     BP 08/10/14 2206 114/70 mmHg     Pulse Rate 08/10/14 2206 79     Resp --      Temp 08/10/14 2206 98.2 F (36.8 C)     Temp Source 08/10/14 2206 Oral     SpO2 08/10/14 2206 96 %     Weight 08/10/14 2206 170 lb (77.111 kg)     Height 08/10/14 2206 5\' 6"  (1.676 m)     Head Cir --      Peak Flow --      Pain Score 08/10/14 2219 9     Pain Loc --      Pain Edu? --      Excl. in GC? --     Constitutional: Alert, attentive, and oriented appropriately for age. Well appearing and in no acute distress.  Eyes: Conjunctivae are normal. PERRL. EOMI. Head: Atraumatic and normocephalic. Nose: No congestion/rhinnorhea. Mouth/Throat: Mucous membranes are moist.  Oropharynx non-erythematous.  Neck: No stridor.   Cardiovascular: Normal rate, regular rhythm. Grossly normal heart sounds.  Good peripheral circulation with normal cap refill. Respiratory: Normal respiratory effort.  No retractions. Lungs CTAB with no W/R/R. Gastrointestinal: Soft and nontender. No distention. Musculoskeletal: Non-tender with normal range of motion in all extremities.  No joint effusions.  Weight-bearing without difficulty. Pain with palpation of his left upper chest wall Neurologic:  Appropriate for age. No gross focal neurologic deficits are appreciated.  No gait instability.   Skin:  Skin is warm, dry and intact. No rash  noted.   ____________________________________________    PROCEDURES  Procedure(s) performed: None  Critical Care performed: No  ____________________________________________   INITIAL IMPRESSION / ASSESSMENT AND PLAN / ED COURSE  Pertinent labs & imaging results that were available during my care of the patient were reviewed by me and considered in my medical decision making (see chart for details).  Initial impression on this patient muscle strain states he felt the pain after lifting something in his left chest wall pain is reproducible on movement and palpation particularly in the upper left side of his chest he has no radiation or shortness of breath no chest pain that isn't associated with movement of his muscle plan for this patient is to place him in a sling and have him ice the area rest for the next couple days take symptomatic medication and follow-up with orthopedics if the pain persists return here for any acute concerns or worsening symptoms ____________________________________________   FINAL CLINICAL IMPRESSION(S) / ED DIAGNOSES  Final diagnoses:  Chest wall muscle strain, initial encounter     Korbyn Chopin Rosalyn Gess, PA-C 08/10/14 2328  Loleta Rose, MD 08/11/14 [phone]

## 2014-08-10 NOTE — ED Notes (Signed)
Pt arrived to the ED for complaints of left shoulder pain. Pt states that the pain started one day ago after coming from work. Pt states that moving the arm makes the pain worse and medication does not help. Pt has a repetitive motion work and makes the pain worse. Pt is AOx4 in no apparent distress.

## 2017-05-06 ENCOUNTER — Encounter: Payer: Self-pay | Admitting: Gastroenterology

## 2017-05-09 ENCOUNTER — Ambulatory Visit: Payer: BLUE CROSS/BLUE SHIELD | Admitting: Gastroenterology

## 2017-05-09 ENCOUNTER — Encounter: Payer: Self-pay | Admitting: Gastroenterology

## 2017-05-09 ENCOUNTER — Other Ambulatory Visit: Payer: Self-pay

## 2017-05-09 ENCOUNTER — Encounter (INDEPENDENT_AMBULATORY_CARE_PROVIDER_SITE_OTHER): Payer: Self-pay

## 2017-05-09 VITALS — BP 152/89 | HR 73 | Temp 98.2°F | Ht 66.0 in | Wt 172.6 lb

## 2017-05-09 DIAGNOSIS — R945 Abnormal results of liver function studies: Secondary | ICD-10-CM | POA: Diagnosis not present

## 2017-05-09 DIAGNOSIS — R7989 Other specified abnormal findings of blood chemistry: Secondary | ICD-10-CM

## 2017-05-09 DIAGNOSIS — K921 Melena: Secondary | ICD-10-CM

## 2017-05-09 NOTE — Progress Notes (Signed)
Miguel Repressohini R Kaeleb Emond, MD [address]  [address]  [place], [place] [address]  Main: [phone]  Fax: [phone]    Gastroenterology Consultation  Referring Provider:     Oswaldo ConroyBender, Abby Daneele, MD Primary Care Physician:  Patient, No Pcp Per Primary Gastroenterologist:  Dr. Arlyss Repressohini R Aaban Griep Reason for Consultation:     Positive occult blood in stool        HPI:   Miguel [name] is a 57 y.o. male referred  for consultation & management of positive stool occult blood. Patient is otherwise healthy, does not have any GI complaints today. He denies rectal bleeding. He never had a colonoscopy before. He underwent cholecystectomy in 02/2010 as he had episode of gallstone pancreatitis. He does not smoke but consumes alcohol regularly  NSAIDs: none  Antiplts/Anticoagulants/Anti thrombotics: none  GI Procedures: EGD 02/28/2010 Diagnosis:  ANTRUM COLD BIOPSY:  - ANTRAL MUCOSA WITH REACTIVE FOVEOLAR HYPERPLASIA AND EPITHELIAL  CHANGES, MILD STROMAL FIBROSIS, AND INTESTINAL METAPLASIA;  COMPATIBLE WITH CHRONIC MUCOSAL INJURY.  - NEGATIVE FOR H.PYLORI, DYSPLASIA AND MALIGNANCY.   He denies family history of colon cancer or other GI malignancies  History reviewed. No pertinent past medical history.  History reviewed. No pertinent surgical history.  No current outpatient medications on file.  History reviewed. No pertinent family history.   Social History   Tobacco Use  . Smoking status: Never Smoker  . Smokeless tobacco: Never Used  Substance Use Topics  . Alcohol use: Yes  . Drug use: No    Allergies as of 05/09/2017  . (No Known Allergies)    Review of Systems:    All systems reviewed and negative except where noted in HPI.   Physical Exam:  BP (!) 152/89   Pulse 73   Temp 98.2 F (36.8 C) (Oral)   Ht 5\' 6"  (1.676 m)   Wt 172 lb 9.6 oz (78.3 kg)   BMI 27.86 kg/m  No LMP for male patient.  General:   Alert,  Well-developed, well-nourished, pleasant and  cooperative in NAD Head:  Normocephalic and atraumatic. Eyes:  Sclera clear, no icterus.   Conjunctiva pink. Ears:  Normal auditory acuity. Nose:  No deformity, discharge, or lesions. Mouth:  No deformity or lesions,oropharynx pink & moist. Neck:  Supple; no masses or thyromegaly. Lungs:  Respirations even and unlabored.  Clear throughout to auscultation.   No wheezes, crackles, or rhonchi. No acute distress. Heart:  Regular rate and rhythm; no murmurs, clicks, rubs, or gallops. Abdomen:  Normal bowel sounds. Soft, obese, non-tender and non-distended without masses, hepatosplenomegaly or hernias noted.  No guarding or rebound tenderness.   Rectal: Not performed Msk:  Symmetrical without gross deformities. Good, equal movement & strength bilaterally. Pulses:  Normal pulses noted. Extremities:  No clubbing or edema.  No cyanosis. Neurologic:  Alert and oriented x3;  grossly normal neurologically. Skin:  Intact without significant lesions or rashes. No jaundice. Psych:  Alert and cooperative. Normal mood and affect.  Imaging Studies: Reviewed  Assessment and Plan:   Miguel RearJose P [name] is a 57 y.o. Spanish-speaking, Hispanic male with history of alcohol use, cholecystectomy seen in consultation to discuss about colonoscopy for stool occult blood positive. He is overdue for colon cancer screening. Therefore, recommend colonoscopy  I have discussed alternative options, risks & benefits,  which include, but are not limited to, bleeding, infection, perforation,respiratory complication & drug reaction.  The patient agrees with this plan & written consent will be obtained.    Recheck CBC,  ferritin, CMP  Follow up based on above labs and colonoscopy results   Miguel Repress, MD

## 2017-05-21 ENCOUNTER — Ambulatory Visit
Admission: RE | Admit: 2017-05-21 | Discharge: 2017-05-21 | Disposition: A | Discharge status: Home / Self Care | Payer: BLUE CROSS/BLUE SHIELD | Source: Ambulatory Visit | Attending: Gastroenterology | Admitting: Gastroenterology

## 2017-05-21 ENCOUNTER — Ambulatory Visit: Payer: BLUE CROSS/BLUE SHIELD | Admitting: Anesthesiology

## 2017-05-21 ENCOUNTER — Encounter
Admission: RE | Disposition: A | Discharge status: Home / Self Care | Payer: Self-pay | Source: Ambulatory Visit | Attending: Gastroenterology

## 2017-05-21 ENCOUNTER — Encounter: Payer: Self-pay | Admitting: *Deleted

## 2017-05-21 DIAGNOSIS — Z1211 Encounter for screening for malignant neoplasm of colon: Secondary | ICD-10-CM | POA: Diagnosis not present

## 2017-05-21 DIAGNOSIS — K921 Melena: Secondary | ICD-10-CM

## 2017-05-21 DIAGNOSIS — R195 Other fecal abnormalities: Secondary | ICD-10-CM

## 2017-05-21 HISTORY — PX: COLONOSCOPY WITH PROPOFOL: SHX5780

## 2017-05-21 SURGERY — COLONOSCOPY WITH PROPOFOL
Anesthesia: General

## 2017-05-21 MED ORDER — PROPOFOL 500 MG/50ML IV EMUL
INTRAVENOUS | Status: AC
  Filled 2017-05-21: qty 50

## 2017-05-21 MED ORDER — SODIUM CHLORIDE 0.9 % IV SOLN
INTRAVENOUS | Status: DC
  Administered 2017-05-21: 1000 mL via INTRAVENOUS

## 2017-05-21 MED ORDER — PHENYLEPHRINE HCL 10 MG/ML IJ SOLN
INTRAMUSCULAR | Status: AC
  Filled 2017-05-21: qty 1

## 2017-05-21 MED ORDER — PROPOFOL 500 MG/50ML IV EMUL
INTRAVENOUS | Status: DC | PRN
  Administered 2017-05-21: 100 ug/kg/min via INTRAVENOUS

## 2017-05-21 MED ORDER — PROPOFOL 10 MG/ML IV BOLUS
INTRAVENOUS | Status: DC | PRN
  Administered 2017-05-21: 100 mg via INTRAVENOUS

## 2017-05-21 NOTE — H&P (Addendum)
  Arlyss Repressohini R Nasha Diss, MD [address]  [address]  [place], [place] [address]  Main: [phone]  Fax: [phone] Pager: [phone]  Primary Care Physician:  [facility] Primary Gastroenterologist:  Dr. Arlyss Repressohini R Shermar Friedland  Pre-Procedure History & Physical: HPI:  Miguel [name] is a 57 y.o. male is here for an colonoscopy.   History reviewed. No pertinent past medical history.  Past Surgical History:  Procedure Laterality Date  . BACK SURGERY N/A   . CHOLECYSTECTOMY      Prior to Admission medications   Not on File    Allergies as of 05/09/2017  . (No Known Allergies)    History reviewed. No pertinent family history.  Social History   Socioeconomic History  . Marital status: Single    Spouse name: Not on file  . Number of children: Not on file  . Years of education: Not on file  . Highest education level: Not on file  Occupational History  . Not on file  Social Needs  . Financial resource strain: Not on file  . Food insecurity:    Worry: Not on file    Inability: Not on file  . Transportation needs:    Medical: Not on file    Non-medical: Not on file  Tobacco Use  . Smoking status: Never Smoker  . Smokeless tobacco: Never Used  Substance and Sexual Activity  . Alcohol use: Yes    Comment: occ  . Drug use: No  . Sexual activity: Yes    Birth control/protection: None  Lifestyle  . Physical activity:    Days per week: Not on file    Minutes per session: Not on file  . Stress: Not on file  Relationships  . Social connections:    Talks on phone: Not on file    Gets together: Not on file    Attends religious service: Not on file    Active member of club or organization: Not on file    Attends meetings of clubs or organizations: Not on file    Relationship status: Not on file  . Intimate partner violence:    Fear of current or ex partner: Not on file    Emotionally abused: Not on file    Physically abused: Not on file   Forced sexual activity: Not on file  Other Topics Concern  . Not on file  Social History Narrative  . Not on file    Review of Systems: See HPI, otherwise negative ROS  Physical Exam: BP 122/82   Pulse 61   Temp 97.8 F (36.6 C) (Tympanic)   Resp 20   SpO2 100%  General:   Alert,  pleasant and cooperative in NAD Head:  Normocephalic and atraumatic. Neck:  Supple; no masses or thyromegaly. Lungs:  Clear throughout to auscultation.    Heart:  Regular rate and rhythm. Abdomen:  Soft, nontender and nondistended. Normal bowel sounds, without guarding, and without rebound.   Neurologic:  Alert and  oriented x4;  grossly normal neurologically.  Impression/Plan: Miguel [name] is here for an colonoscopy to be performed for FOBT+  Risks, benefits, limitations, and alternatives regarding  colonoscopy have been reviewed with the patient.  Questions have been answered.  All parties agreeable.   Lannette Donathohini Monish Haliburton, MD  05/21/2017, 1:26 PM

## 2017-05-21 NOTE — Transfer of Care (Signed)
Immediate Anesthesia Transfer of Care Note  Patient: Miguel [name]  Procedure(s) Performed: COLONOSCOPY WITH PROPOFOL (N/A )  Patient Location: PACU and Endoscopy Unit  Anesthesia Type:General  Level of Consciousness: drowsy and patient cooperative  Airway & Oxygen Therapy: Patient Spontanous Breathing and Patient connected to nasal cannula oxygen  Post-op Assessment: Report given to RN and Post -op Vital signs reviewed and stable  Post vital signs: Reviewed and stable  Last Vitals:  Vitals Value Taken Time  BP 114/77 05/21/2017  1:49 PM  Temp 36.1 C 05/21/2017  1:49 PM  Pulse 58 05/21/2017  1:51 PM  Resp 16 05/21/2017  1:51 PM  SpO2 100 % 05/21/2017  1:51 PM  Vitals shown include unvalidated device data.  Last Pain:  Vitals:   05/21/17 1349  TempSrc: Tympanic      Patients Stated Pain Goal: 0 (05/21/17 1300)  Complications: No apparent anesthesia complications

## 2017-05-21 NOTE — Anesthesia Post-op Follow-up Note (Signed)
Anesthesia QCDR form completed.        

## 2017-05-21 NOTE — Op Note (Signed)
[facility] Gastroenterology Patient Name: Miguel [name] Procedure Date: 05/21/2017 12:30 PM MRN: [id number] Account #: [id number] Date of Birth: April 17, 1960 Admit Type: Outpatient Age: 57 Room: [facility] ENDO ROOM 2 Gender: Male Note Status: Finalized Procedure:            Colonoscopy Indications:          This is the patient's first colonoscopy, Heme positive                        stool Providers:            Lin Landsman MD, MD Referring MD:         [facility] Dubois (Referring MD) Medicines:            Monitored Anesthesia Care Complications:        No immediate complications. Estimated blood loss: None. Procedure:            Pre-Anesthesia Assessment:                       - Prior to the procedure, a History and Physical was                        performed, and patient medications and allergies were                        reviewed. The patient is competent. The risks and                        benefits of the procedure and the sedation options and                        risks were discussed with the patient. All questions                        were answered and informed consent was obtained.                        Patient identification and proposed procedure were                        verified by the physician, the nurse, the                        anesthesiologist, the anesthetist and the technician in                        the pre-procedure area in the procedure room in the                        endoscopy suite. Mental Status Examination: alert and                        oriented. Airway Examination: normal oropharyngeal                        airway and neck mobility. Respiratory Examination:                        clear to auscultation. CV Examination: normal.  Prophylactic Antibiotics: The patient does not require                        prophylactic antibiotics. Prior Anticoagulants: The   patient has taken no previous anticoagulant or                        antiplatelet agents. ASA Grade Assessment: II - A                        patient with mild systemic disease. After reviewing the                        risks and benefits, the patient was deemed in                        satisfactory condition to undergo the procedure. The                        anesthesia plan was to use monitored anesthesia care                        (MAC). Immediately prior to administration of                        medications, the patient was re-assessed for adequacy                        to receive sedatives. The heart rate, respiratory rate,                        oxygen saturations, blood pressure, adequacy of                        pulmonary ventilation, and response to care were                        monitored throughout the procedure. The physical status                        of the patient was re-assessed after the procedure.                       After obtaining informed consent, the colonoscope was                        passed under direct vision. Throughout the procedure,                        the patient's blood pressure, pulse, and oxygen                        saturations were monitored continuously. The                        Colonoscope was introduced through the anus and                        advanced to the the cecum, identified by appendiceal  orifice and ileocecal valve. The colonoscopy was                        performed without difficulty. The patient tolerated the                        procedure well. The quality of the bowel preparation                        was evaluated using the BBPS [facility] Bowel Preparation                        Scale) with scores of: Right Colon = 1 (portion of                        mucosa seen, but other areas not well seen due to                        staining, residual stool and/or opaque liquid),                         Transverse Colon = 3 (entire mucosa seen well with no                        residual staining, small fragments of stool or opaque                        liquid) and Left Colon = 3 (entire mucosa seen well                        with no residual staining, small fragments of stool or                        opaque liquid). The total BBPS score equals 7. The                        quality of the bowel preparation was inadequate. Findings:      The perianal and digital rectal examinations were normal. Pertinent       negatives include normal sphincter tone and no palpable rectal lesions.      A large amount of solid food particles were found in the ascending colon       and in the cecum, precluding visualization.      The retroflexed view of the distal rectum and anal verge was normal and       showed no anal or rectal abnormalities.      The exam was otherwise without abnormality. Impression:           - Preparation of the colon was inadequate.                       - Stool in the ascending colon and in the cecum.                       - The distal rectum and anal verge are normal on                        retroflexion view.                       -  The examination was otherwise normal.                       - No specimens collected. Recommendation:       - Discharge patient to home.                       - Resume previous diet today.                       - Repeat colonoscopy in 6 months or sooner because the                        bowel preparation was poor. Procedure Code(s):    --- Professional ---                       [phone], Colonoscopy, flexible; diagnostic, including                        collection of specimen(s) by brushing or washing, when                        performed (separate procedure) Diagnosis Code(s):    --- Professional ---                       R19.5, Other fecal abnormalities CPT copyright 2016 American Medical Association. All rights reserved. The codes  documented in this report are preliminary and upon coder review may  be revised to meet current compliance requirements. Dr. Ulyess Mort Lin Landsman MD, MD 05/21/2017 1:45:58 PM This report has been signed electronically. Number of Addenda: 0 Note Initiated On: 05/21/2017 12:30 PM Scope Withdrawal Time: 0 hours 6 minutes 35 seconds  Total Procedure Duration: 0 hours 11 minutes 32 seconds       [facility]

## 2017-05-21 NOTE — Anesthesia Preprocedure Evaluation (Addendum)
Anesthesia Evaluation  Patient identified by MRN, date of birth, ID band Patient awake    Reviewed: Allergy & Precautions, H&P , NPO status , Patient's Chart, lab work & pertinent test results, reviewed documented beta blocker date and time   Airway Mallampati: II   Neck ROM: full    Dental  (+) Poor Dentition   Pulmonary neg pulmonary ROS,    Pulmonary exam normal        Cardiovascular negative cardio ROS Normal cardiovascular exam Rhythm:regular Rate:Normal     Neuro/Psych negative neurological ROS  negative psych ROS   GI/Hepatic negative GI ROS, Neg liver ROS,   Endo/Other  negative endocrine ROS  Renal/GU negative Renal ROS  negative genitourinary   Musculoskeletal   Abdominal   Peds  Hematology negative hematology ROS (+)   Anesthesia Other Findings History reviewed. No pertinent past medical history. Past Surgical History: No date: BACK SURGERY; N/A No date: CHOLECYSTECTOMY   Reproductive/Obstetrics negative OB ROS                             Anesthesia Physical Anesthesia Plan  ASA: II  Anesthesia Plan: General   Post-op Pain Management:    Induction:   PONV Risk Score and Plan:   Airway Management Planned:   Additional Equipment:   Intra-op Plan:   Post-operative Plan:   Informed Consent: I have reviewed the patients History and Physical, chart, labs and discussed the procedure including the risks, benefits and alternatives for the proposed anesthesia with the patient or authorized representative who has indicated his/her understanding and acceptance.   Dental Advisory Given  Plan Discussed with: CRNA  Anesthesia Plan Comments:        Anesthesia Quick Evaluation

## 2017-05-22 ENCOUNTER — Encounter
Admission: RE | Disposition: A | Discharge status: Home / Self Care | Payer: Self-pay | Source: Ambulatory Visit | Attending: Gastroenterology

## 2017-05-22 ENCOUNTER — Ambulatory Visit: Payer: BLUE CROSS/BLUE SHIELD | Admitting: Anesthesiology

## 2017-05-22 ENCOUNTER — Encounter: Payer: Self-pay | Admitting: Gastroenterology

## 2017-05-22 ENCOUNTER — Ambulatory Visit
Admission: RE | Admit: 2017-05-22 | Discharge: 2017-05-22 | Disposition: A | Discharge status: Home / Self Care | Payer: BLUE CROSS/BLUE SHIELD | Source: Ambulatory Visit | Attending: Gastroenterology | Admitting: Gastroenterology

## 2017-05-22 DIAGNOSIS — K644 Residual hemorrhoidal skin tags: Secondary | ICD-10-CM | POA: Diagnosis not present

## 2017-05-22 DIAGNOSIS — Z1211 Encounter for screening for malignant neoplasm of colon: Secondary | ICD-10-CM | POA: Insufficient documentation

## 2017-05-22 DIAGNOSIS — R195 Other fecal abnormalities: Secondary | ICD-10-CM

## 2017-05-22 HISTORY — PX: COLONOSCOPY WITH PROPOFOL: SHX5780

## 2017-05-22 SURGERY — COLONOSCOPY WITH PROPOFOL
Anesthesia: General

## 2017-05-22 MED ORDER — PROPOFOL 500 MG/50ML IV EMUL
INTRAVENOUS | Status: DC | PRN
  Administered 2017-05-22: 150 ug/kg/min via INTRAVENOUS

## 2017-05-22 MED ORDER — PROPOFOL 500 MG/50ML IV EMUL
INTRAVENOUS | Status: AC
  Filled 2017-05-22: qty 50

## 2017-05-22 MED ORDER — SODIUM CHLORIDE 0.9 % IV SOLN
INTRAVENOUS | Status: DC
  Administered 2017-05-22: 16:00:00 via INTRAVENOUS
  Administered 2017-05-22: 1000 mL via INTRAVENOUS

## 2017-05-22 MED ORDER — PROPOFOL 10 MG/ML IV BOLUS
INTRAVENOUS | Status: DC | PRN
  Administered 2017-05-22: 70 mg via INTRAVENOUS

## 2017-05-22 NOTE — Transfer of Care (Signed)
Immediate Anesthesia Transfer of Care Note  Patient: Miguel [name] Counts  Procedure(s) Performed: COLONOSCOPY WITH PROPOFOL (N/A )  Patient Location: PACU  Anesthesia Type:General  Level of Consciousness: awake, alert  and oriented  Airway & Oxygen Therapy: Patient Spontanous Breathing and Patient connected to nasal cannula oxygen  Post-op Assessment: Report given to RN and Post -op Vital signs reviewed and stable  Post vital signs: Reviewed and stable  Last Vitals:  Vitals Value Taken Time  BP 98/55 05/22/2017  4:19 PM  Temp 36.1 C 05/22/2017  4:19 PM  Pulse 74 05/22/2017  4:19 PM  Resp 16 05/22/2017  4:19 PM  SpO2 97 % 05/22/2017  4:19 PM    Last Pain:  Vitals:   05/22/17 1618  TempSrc: Tympanic  PainSc:       Patients Stated Pain Goal: 0 (05/22/17 1508)  Complications: No apparent anesthesia complications

## 2017-05-22 NOTE — Anesthesia Postprocedure Evaluation (Signed)
Anesthesia Post Note  Patient: Miguel [name]  Procedure(s) Performed: COLONOSCOPY WITH PROPOFOL (N/A )  Patient location during evaluation: Endoscopy Anesthesia Type: General Level of consciousness: awake and alert Pain management: pain level controlled Vital Signs Assessment: post-procedure vital signs reviewed and stable Respiratory status: spontaneous breathing and respiratory function stable Cardiovascular status: stable Anesthetic complications: no     Last Vitals:  Vitals:   05/22/17 1618 05/22/17 1619  BP: (!) 98/55 (!) 98/55  Pulse: 73 74  Resp: 15 16  Temp: (!) 36.1 C (!) 36.1 C  SpO2: 98% 97%    Last Pain:  Vitals:   05/22/17 1619  TempSrc:   PainSc: 0-No pain                 Sasuke Yaffe K

## 2017-05-22 NOTE — Anesthesia Procedure Notes (Signed)
Date/Time: 05/22/2017 4:01 PM Performed by: Junious SilkNoles, Avory Rahimi, CRNA Pre-anesthesia Checklist: Patient identified, Emergency Drugs available, Suction available, Patient being monitored and Timeout performed Oxygen Delivery Method: Nasal cannula

## 2017-05-22 NOTE — Anesthesia Post-op Follow-up Note (Signed)
Anesthesia QCDR form completed.        

## 2017-05-22 NOTE — H&P (Signed)
  Arlyss Repressohini R Blakleigh Straw, MD [address]  [address]  [place], [place] [address]  Main: [phone]  Fax: [phone] Pager: [phone]  Primary Care Physician:  [facility] Primary Gastroenterologist:  Dr. Arlyss Repressohini R Elisandro Jarrett  Pre-Procedure History & Physical: HPI:  Miguel RearJose P [name] is a 57 y.o. male is here for an colonoscopy.   History reviewed. No pertinent past medical history.  Past Surgical History:  Procedure Laterality Date  . BACK SURGERY N/A   . CHOLECYSTECTOMY      Prior to Admission medications   Not on File    Allergies as of 05/21/2017  . (No Known Allergies)    History reviewed. No pertinent family history.  Social History   Socioeconomic History  . Marital status: Single    Spouse name: Not on file  . Number of children: Not on file  . Years of education: Not on file  . Highest education level: Not on file  Occupational History  . Not on file  Social Needs  . Financial resource strain: Not on file  . Food insecurity:    Worry: Not on file    Inability: Not on file  . Transportation needs:    Medical: Not on file    Non-medical: Not on file  Tobacco Use  . Smoking status: Never Smoker  . Smokeless tobacco: Never Used  Substance and Sexual Activity  . Alcohol use: Yes    Comment: occ  . Drug use: No  . Sexual activity: Yes    Birth control/protection: None  Lifestyle  . Physical activity:    Days per week: Not on file    Minutes per session: Not on file  . Stress: Not on file  Relationships  . Social connections:    Talks on phone: Not on file    Gets together: Not on file    Attends religious service: Not on file    Active member of club or organization: Not on file    Attends meetings of clubs or organizations: Not on file    Relationship status: Not on file  . Intimate partner violence:    Fear of current or ex partner: Not on file    Emotionally abused: Not on file    Physically abused: Not on file   Forced sexual activity: Not on file  Other Topics Concern  . Not on file  Social History Narrative  . Not on file    Review of Systems: See HPI, otherwise negative ROS  Physical Exam: BP 126/84   Pulse 76   Temp 98.5 F (36.9 C) (Tympanic)   Resp 20   Ht 5\' 6"  (1.676 m)   SpO2 98%   BMI 27.86 kg/m  General:   Alert,  pleasant and cooperative in NAD Head:  Normocephalic and atraumatic. Neck:  Supple; no masses or thyromegaly. Lungs:  Clear throughout to auscultation.    Heart:  Regular rate and rhythm. Abdomen:  Soft, nontender and nondistended. Normal bowel sounds, without guarding, and without rebound.   Neurologic:  Alert and  oriented x4;  grossly normal neurologically.  Impression/Plan: Miguel [name] is here for an colonoscopy to be performed for FOBT+  Risks, benefits, limitations, and alternatives regarding  colonoscopy have been reviewed with the patient.  Questions have been answered.  All parties agreeable.   Lannette Donathohini Amparo Donalson, MD  05/22/2017, 3:12 PM

## 2017-05-22 NOTE — Anesthesia Preprocedure Evaluation (Addendum)
Anesthesia Evaluation  Patient identified by MRN, date of birth, ID band Patient awake    Reviewed: Allergy & Precautions, NPO status , Patient's Chart, lab work & pertinent test results  History of Anesthesia Complications Negative for: history of anesthetic complications  Airway Mallampati: III       Dental  (+) Missing, Chipped   Pulmonary neg sleep apnea, neg COPD,           Cardiovascular (-) hypertension(-) Past MI and (-) CHF (-) dysrhythmias (-) Valvular Problems/Murmurs     Neuro/Psych neg Seizures    GI/Hepatic Neg liver ROS, neg GERD  ,(+)     substance abuse  alcohol use,   Endo/Other  neg diabetes  Renal/GU negative Renal ROS     Musculoskeletal   Abdominal   Peds  Hematology   Anesthesia Other Findings   Reproductive/Obstetrics                            Anesthesia Physical Anesthesia Plan  ASA: II  Anesthesia Plan: General   Post-op Pain Management:    Induction: Intravenous  PONV Risk Score and Plan: 2 and Propofol infusion and TIVA  Airway Management Planned: Nasal Cannula  Additional Equipment:   Intra-op Plan:   Post-operative Plan:   Informed Consent: I have reviewed the patients History and Physical, chart, labs and discussed the procedure including the risks, benefits and alternatives for the proposed anesthesia with the patient or authorized representative who has indicated his/her understanding and acceptance.     Plan Discussed with:   Anesthesia Plan Comments:         Anesthesia Quick Evaluation

## 2017-05-22 NOTE — Op Note (Signed)
[facility] Patient Name: Miguel [name] Procedure Date: 05/22/2017 3:52 PM MRN: [id number] Account #: [id number] Date of Birth: Dec 05, 1960 Admit Type: Outpatient Age: 57 Room: [facility] ENDO ROOM 4 Gender: Male Note Status: Finalized Procedure:            Colonoscopy Indications:          Screening for colorectal malignant neoplasm Providers:            Lin Landsman MD, MD Medicines:            Monitored Anesthesia Care Complications:        No immediate complications. Estimated blood loss: None. Procedure:            Pre-Anesthesia Assessment:                       - Prior to the procedure, a History and Physical was                        performed, and patient medications and allergies were                        reviewed. The patient is competent. The risks and                        benefits of the procedure and the sedation options and                        risks were discussed with the patient. All questions                        were answered and informed consent was obtained.                        Patient identification and proposed procedure were                        verified by the physician, the nurse, the                        anesthesiologist, the anesthetist and the technician in                        the pre-procedure area in the procedure room in the                        endoscopy suite. Mental Status Examination: alert and                        oriented. Airway Examination: normal oropharyngeal                        airway and neck mobility. Respiratory Examination:                        clear to auscultation. CV Examination: normal.                        Prophylactic Antibiotics: The patient does not require  prophylactic antibiotics. Prior Anticoagulants: The                        patient has taken no previous anticoagulant or                        antiplatelet agents. ASA Grade Assessment:  II - A                        patient with mild systemic disease. After reviewing the                        risks and benefits, the patient was deemed in                        satisfactory condition to undergo the procedure. The                        anesthesia plan was to use monitored anesthesia care                        (MAC). Immediately prior to administration of                        medications, the patient was re-assessed for adequacy                        to receive sedatives. The heart rate, respiratory rate,                        oxygen saturations, blood pressure, adequacy of                        pulmonary ventilation, and response to care were                        monitored throughout the procedure. The physical status                        of the patient was re-assessed after the procedure.                       After obtaining informed consent, the colonoscope was                        passed under direct vision. Throughout the procedure,                        the patient's blood pressure, pulse, and oxygen                        saturations were monitored continuously. The                        Colonoscope was introduced through the anus and                        advanced to the the terminal ileum. The colonoscopy was  performed without difficulty. The patient tolerated the                        procedure well. The quality of the bowel preparation                        was evaluated using the BBPS [facility] Bowel Preparation                        Scale) with scores of: Right Colon = 3, Transverse                        Colon = 3 and Left Colon = 3 (entire mucosa seen well                        with no residual staining, small fragments of stool or                        opaque liquid). The total BBPS score equals 9. Findings:      The perianal and digital rectal examinations were normal. Pertinent       negatives include normal  sphincter tone and no palpable rectal lesions.      The terminal ileum appeared normal.      External hemorrhoids were found during retroflexion. The hemorrhoids       were medium-sized.      The colon (entire examined portion) appeared normal. Impression:           - The examined portion of the ileum was normal.                       - External hemorrhoids.                       - The entire examined colon is normal.                       - No specimens collected. Recommendation:       - Discharge patient to home.                       - Resume previous diet today.                       - Continue present medications.                       - Repeat colonoscopy in 10 years for surveillance. Procedure Code(s):    --- Professional ---                       V4944, Colorectal cancer screening; colonoscopy on                        individual not meeting criteria for high risk Diagnosis Code(s):    --- Professional ---                       Z12.11, Encounter for screening for malignant neoplasm                        of colon  K64.4, Residual hemorrhoidal skin tags CPT copyright 2016 American Medical Association. All rights reserved. The codes documented in this report are preliminary and upon coder review may  be revised to meet current compliance requirements. Dr. Ulyess Mort Lin Landsman MD, MD 05/22/2017 4:17:07 PM This report has been signed electronically. Number of Addenda: 0 Note Initiated On: 05/22/2017 3:52 PM Scope Withdrawal Time: 0 hours 9 minutes 9 seconds  Total Procedure Duration: 0 hours 13 minutes 59 seconds       [facility]

## 2017-05-23 NOTE — Anesthesia Postprocedure Evaluation (Signed)
Anesthesia Post Note  Patient: Madelin RearJose P Nish  Procedure(s) Performed: COLONOSCOPY WITH PROPOFOL (N/A )  Patient location during evaluation: PACU Anesthesia Type: General Level of consciousness: awake and alert Pain management: pain level controlled Vital Signs Assessment: post-procedure vital signs reviewed and stable Respiratory status: spontaneous breathing, nonlabored ventilation, respiratory function stable and patient connected to nasal cannula oxygen Cardiovascular status: blood pressure returned to baseline and stable Postop Assessment: no apparent nausea or vomiting Anesthetic complications: no     Last Vitals:  Vitals:   05/21/17 1359 05/21/17 1409  BP: 121/76 116/86  Pulse: (!) 56 (!) 58  Resp: 14 16  Temp:    SpO2: 97% 97%    Last Pain:  Vitals:   05/21/17 1409  TempSrc:   PainSc: 0-No pain                 Yevette EdwardsJames G Milarose Savich

## 2019-02-07 ENCOUNTER — Other Ambulatory Visit: Payer: Self-pay

## 2019-02-07 ENCOUNTER — Emergency Department: Payer: Worker's Compensation

## 2019-02-07 ENCOUNTER — Encounter: Payer: Self-pay | Admitting: Emergency Medicine

## 2019-02-07 ENCOUNTER — Emergency Department
Admission: EM | Admit: 2019-02-07 | Discharge: 2019-02-07 | Disposition: A | Discharge status: Home / Self Care | Payer: Worker's Compensation | Attending: Emergency Medicine | Admitting: Emergency Medicine

## 2019-02-07 DIAGNOSIS — S8992XA Unspecified injury of left lower leg, initial encounter: Secondary | ICD-10-CM

## 2019-02-07 DIAGNOSIS — M25462 Effusion, left knee: Secondary | ICD-10-CM | POA: Insufficient documentation

## 2019-02-07 DIAGNOSIS — W3182XA Contact with other commercial machinery, initial encounter: Secondary | ICD-10-CM | POA: Diagnosis not present

## 2019-02-07 DIAGNOSIS — R229 Localized swelling, mass and lump, unspecified: Secondary | ICD-10-CM

## 2019-02-07 DIAGNOSIS — Y929 Unspecified place or not applicable: Secondary | ICD-10-CM | POA: Diagnosis not present

## 2019-02-07 DIAGNOSIS — Y990 Civilian activity done for income or pay: Secondary | ICD-10-CM | POA: Diagnosis not present

## 2019-02-07 DIAGNOSIS — Y9389 Activity, other specified: Secondary | ICD-10-CM | POA: Diagnosis not present

## 2019-02-07 MED ORDER — IBUPROFEN 600 MG PO TABS
600.0000 mg | ORAL_TABLET | Freq: Once | ORAL | Status: AC
  Administered 2019-02-07: 600 mg via ORAL
  Filled 2019-02-07: qty 1

## 2019-02-07 MED ORDER — IBUPROFEN 600 MG PO TABS: 600.0000 mg | ORAL_TABLET | Freq: Four times a day (QID) | ORAL | 0 refills | Status: AC | PRN

## 2019-02-07 NOTE — ED Notes (Signed)
Pt states he was at work he was bringing down the yarn and the machine hit him. Pt states this happened yesterday at 345 pm. Pt states he did not have much pain yesterday but today he has "excruciating" pain when he moves his left knee. This RN spoke to pt using [employer] Spanish interpreter.

## 2019-02-07 NOTE — ED Triage Notes (Signed)
States yesterday at work metal machinery came down and hit L knee. Painful since. Works for [employer].

## 2019-02-07 NOTE — ED Provider Notes (Signed)
[facility] Emergency Department Provider Note  ____________________________________________  Time seen: Approximately 10:29 PM  I have reviewed the triage vital signs and the nursing notes.   HISTORY  Chief Complaint Knee Pain    HPI Miguel [name] is a 58 y.o. male that presents to the emergency department for evaluation of left knee pain after injury yesterday.  Patient states that he was at work using a thread machine and he hit his left knee on the machine.  He has pain whenever he bends his knee.  He denies any additional injuries.  He has been walking.  No numbness, tingling.   History reviewed. No pertinent past medical history.  Patient Active Problem List   Diagnosis Date Noted  . Positive fecal occult blood test     Past Surgical History:  Procedure Laterality Date  . BACK SURGERY N/A   . CHOLECYSTECTOMY    . COLONOSCOPY WITH PROPOFOL N/A 05/21/2017   Procedure: COLONOSCOPY WITH PROPOFOL;  Surgeon: Lin Landsman, MD;  Location[facility]COPY;  Service: Gastroenterology;  Laterality: N/A;  . COLONOSCOPY WITH PROPOFOL N/A 05/22/2017   Procedure: COLONOSCOPY WITH PROPOFOL;  Surgeon: Lin Landsman, MD;  Location[facility] END[facility]COPY;  Service: Gastroenterology;  Laterality: N/A;    Prior to Admission medications   Medication Sig Start Date End Date Taking? Authorizing Provider  ibuprofen (ADVIL) 600 MG tablet Take 1 tablet (600 mg total) by mouth every 6 (six) hours as needed. 02/07/19   Laban Emperor, PA-C    Allergies Patient has no known allergies.  No family history on file.  Social History Social History   Tobacco Use  . Smoking status: Never Smoker  . Smokeless tobacco: Never Used  Substance Use Topics  . Alcohol use: Yes    Comment: occ  . Drug use: No     Review of Systems  Respiratory: No SOB. Gastrointestinal: No abdominal pain.  No nausea, no vomiting.  Musculoskeletal: Positive for knee pain. Skin:  Negative for rash, abrasions, lacerations, ecchymosis. Neurological: Negative for numbness or tingling   ____________________________________________   PHYSICAL EXAM:  VITAL SIGNS: ED Triage Vitals  Enc Vitals Group     BP 02/07/19 2023 (!) 158/84     Pulse Rate 02/07/19 2023 (!) 104     Resp 02/07/19 2023 20     Temp 02/07/19 2023 99.6 F (37.6 C)     Temp Source 02/07/19 2023 Oral     SpO2 02/07/19 2023 97 %     Weight 02/07/19 2029 162 lb (73.5 kg)     Height 02/07/19 2029 5\' 6"  (1.676 m)     Head Circumference --      Peak Flow --      Pain Score 02/07/19 2028 10     Pain Loc --      Pain Edu? --      Excl. in [place]? --      Constitutional: Alert and oriented. Well appearing and in no acute distress. Eyes: Conjunctivae are normal. PERRL. EOMI. Head: Atraumatic. ENT:      Ears:      Nose: No congestion/rhinnorhea.      Mouth/Throat: Mucous membranes are moist.  Neck: No stridor.  Cardiovascular: Normal rate, regular rhythm.  Good peripheral circulation. Respiratory: Normal respiratory effort without tachypnea or retractions. Lungs CTAB. Good air entry to the bases with no decreased or absent breath sounds. Musculoskeletal: Full range of motion to all extremities. No gross deformities appreciated.  Tenderness to palpation to  left anterior knee.  Full range of motion of knee but with pain.  Normal gait. Neurologic:  Normal speech and language. No gross focal neurologic deficits are appreciated.  Skin:  Skin is warm, dry and intact. No rash noted. Psychiatric: Mood and affect are normal. Speech and behavior are normal. Patient exhibits appropriate insight and judgement.   ____________________________________________   LABS (all labs ordered are listed, but only abnormal results are displayed)  Labs Reviewed - No data to display ____________________________________________  EKG   ____________________________________________  RADIOLOGY Lexine BatonI, Dontre Laduca,  personally viewed and evaluated these images (plain radiographs) as part of my medical decision making, as well as reviewing the written report by the radiologist.  DG Knee Complete 4 Views Left  Result Date: 02/07/2019 CLINICAL DATA:  Injury. EXAM: LEFT KNEE - COMPLETE 4+ VIEW COMPARISON:  None. FINDINGS: There is no acute displaced fracture or dislocation. There is chondrocalcinosis, most notably in the medial compartment. There is a small joint effusion. The joint spaces are relatively well preserved. There is no radiopaque foreign body. There is some mild soft tissue swelling about the knee. IMPRESSION: 1. No acute bony abnormality. 2. Small joint effusion. 3. Chondrocalcinosis. 4. Mild soft tissue swelling about the knee. Electronically Signed   By: Katherine Mantlehristopher  Green M.D.   On: 02/07/2019 21:22    ____________________________________________    PROCEDURES  Procedure(s) performed:    Procedures    Medications  ibuprofen (ADVIL) tablet 600 mg (600 mg Oral Given 02/07/19 2307)     ____________________________________________   INITIAL IMPRESSION / ASSESSMENT AND PLAN / ED COURSE  Pertinent labs & imaging results that were available during my care of the patient were reviewed by me and considered in my medical decision making (see chart for details).  Review of the [place] CSRS was performed in accordance of the [place] prior to dispensing any controlled drugs.   Patient presented to emergency department for evaluation of knee pain after injury yesterday.  Vital signs and exam are reassuring.  X-ray consistent with knee effusion and soft tissue swelling without acute bony abnormalities.  Knee immobilizer was placed.  Crutches were provided.  Patient will be discharged home with prescriptions for Motrin. Patient is to follow up with orthopedics as directed.  Referral was given to Dr. Allena KatzPatel.  Patient is given ED precautions to return to the ED for any worsening or new symptoms.   Miguel  Shanda Bumps [name] was evaluated in Emergency Department on 02/07/2019 for the symptoms described in the history of present illness. He was evaluated in the context of the global COVID-19 pandemic, which necessitated consideration that the patient might be at risk for infection with the SARS-CoV-2 virus that causes COVID-19. Institutional protocols and algorithms that pertain to the evaluation of patients at risk for COVID-19 are in a state of rapid change based on information released by regulatory bodies including the CDC and federal and state organizations. These policies and algorithms were followed during the patient's care in the ED.  ____________________________________________  FINAL CLINICAL IMPRESSION(S) / ED DIAGNOSES  Final diagnoses:  Effusion of left knee  Soft tissue swelling  Injury of left knee, initial encounter      NEW MEDICATIONS STARTED DURING THIS VISIT:  ED Discharge Orders         Ordered    ibuprofen (ADVIL) 600 MG tablet  Every 6 hours PRN     02/07/19 2234              This chart was  dictated using voice recognition software/Dragon. Despite best efforts to proofread, errors can occur which can change the meaning. Any change was purely unintentional.    Enid Derry, PA-C 02/07/19 2321    Phineas Semen, MD 02/08/19 [phone]
